# Patient Record
Sex: Female | Born: 1965 | Hispanic: No | Smoking: Current every day smoker
Health system: Southern US, Community
[De-identification: ages and names within clinical notes are randomized; demographics above are authoritative.]

## PROBLEM LIST (undated history)

## (undated) DIAGNOSIS — M797 Fibromyalgia: Secondary | ICD-10-CM

## (undated) DIAGNOSIS — J45909 Unspecified asthma, uncomplicated: Secondary | ICD-10-CM

## (undated) DIAGNOSIS — J439 Emphysema, unspecified: Secondary | ICD-10-CM

## (undated) DIAGNOSIS — J302 Other seasonal allergic rhinitis: Secondary | ICD-10-CM

## (undated) DIAGNOSIS — K2270 Barrett's esophagus without dysplasia: Secondary | ICD-10-CM

## (undated) DIAGNOSIS — M1990 Unspecified osteoarthritis, unspecified site: Secondary | ICD-10-CM

## (undated) DIAGNOSIS — J849 Interstitial pulmonary disease, unspecified: Secondary | ICD-10-CM

## (undated) DIAGNOSIS — J449 Chronic obstructive pulmonary disease, unspecified: Secondary | ICD-10-CM

## (undated) DIAGNOSIS — M810 Age-related osteoporosis without current pathological fracture: Secondary | ICD-10-CM

## (undated) HISTORY — DX: Other seasonal allergic rhinitis: J30.2

## (undated) HISTORY — DX: Chronic obstructive pulmonary disease, unspecified: J44.9

## (undated) HISTORY — DX: Emphysema, unspecified: J43.9

## (undated) HISTORY — DX: Fibromyalgia: M79.7

## (undated) HISTORY — DX: Interstitial pulmonary disease, unspecified: J84.9

## (undated) HISTORY — DX: Unspecified osteoarthritis, unspecified site: M19.90

## (undated) HISTORY — DX: Unspecified asthma, uncomplicated: J45.909

## (undated) HISTORY — DX: Barrett's esophagus without dysplasia: K22.70

## (undated) HISTORY — DX: Age-related osteoporosis without current pathological fracture: M81.0

---

## 2020-06-06 ENCOUNTER — Other Ambulatory Visit: Payer: Self-pay | Admitting: Critical Care Medicine

## 2020-06-09 ENCOUNTER — Ambulatory Visit
Admission: RE | Admit: 2020-06-09 | Discharge: 2020-06-09 | Disposition: A | Discharge status: Home / Self Care | Payer: 59 | Source: Ambulatory Visit | Attending: Critical Care Medicine | Admitting: Critical Care Medicine

## 2020-06-09 DIAGNOSIS — J45909 Unspecified asthma, uncomplicated: Secondary | ICD-10-CM | POA: Insufficient documentation

## 2020-06-09 MED ORDER — ALBUTEROL SULFATE (2.5 MG/3ML) 0.083% IN NEBU
2.5000 mg | INHALATION_SOLUTION | Freq: Once | RESPIRATORY_TRACT | Status: AC
Start: 2020-06-09 — End: 2020-06-09
  Administered 2020-06-09: 16:00:00 2.5 mg via RESPIRATORY_TRACT

## 2020-06-09 MED ORDER — ALBUTEROL SULFATE (2.5 MG/3ML) 0.083% IN NEBU
INHALATION_SOLUTION | RESPIRATORY_TRACT | Status: AC
Start: 2020-06-09 — End: ?
  Filled 2020-06-09: qty 3

## 2020-06-09 NOTE — Progress Notes (Signed)
06/09/20 1530   Spirometry Pre and/or Post   Procedure Location and Type Diagnostic - Pre and Post   Medications Albuterol   Pre FVC 3.04 L   Pre FEV1 1.28 L   Pre FEV1/FVC % (Calculated) 42   Post FVC 3.36   Post FEV1 1.47   Post FEV1/FVC % (Calculated) 43.75   Adverse Reactions None   Lung Volumes   Method Body box   Procedure status Completed   DLCO   Procedure status Completed   Primary Charges   $ Diffusing Capacity Done? Yes   $ Plethysmography Airway Resistance Performed? Yes   $ Spirometry Type Performed Pre/Post Bronchodialator - CPT [phone]

## 2020-06-13 LAB — SPIROMETRY WITH BRONCHODILATOR
DL/VA- %Pred: 53 %
DL/VA- Actual: 2.21 ml/min/mmHg/L
DL/VA- Pred: 4.17 ml/min/mmHg/L
DLCOunc (Lower Limit of Normal): 16.32 ml/min/mmHg
DLCOunc- %Pred: 53 %
DLCOunc- Actual: 11.22 ml/min/mmHg
DLCOunc- Pred: 21.12 ml/min/mmHg
ERV (Pre-Bronch) %Pred: 78 %
ERV (Pre-Bronch) Actual: 0.95 L
ERV (Pre-Bronch) Pred: 1.21 L
ERVSB (Pre-Actual): 0.95 L
FEF 25-75% (Post-Bronch) %Chng: 0 %
FEF 25-75% (Post-Bronch) %Pred: 21 %
FEF 25-75% (Post-Bronch) Actual: 0.53 L/sec
FEF 25-75% (Pre-Bronch) %Pred: 21 %
FEF 25-75% (Pre-Bronch) Actual: 0.53 L/sec
FEF 25-75% (Pre-Bronch) Pred: 2.43 L/sec
FEF Max (Post-Bronch) %Chng: 16 %
FEF Max (Post-Bronch) %Pred: 49 %
FEF Max (Post-Bronch) Actual: 3.34 L/sec
FEF Max (Pre-Bronch) %Pred: 42 %
FEF Max (Pre-Bronch) Actual: 2.87 L/sec
FEF Max (Pre-Bronch) Pred: 6.81 L/sec
FEF2575 (Lower Limit of Normal): 1.29 L/sec
FEF2575 (Standard Deviation): 0.8 L/sec
FEFMax (Lower Limit of Normal): 4.98 L/sec
FEFMax (Standard Deviation): 1.11 L/sec
FEV1 (Lower Limit of Normal): 1.98 L
FEV1 (Post-Bronch) %Chng: 14 %
FEV1 (Post-Bronch) %Pred: 56 %
FEV1 (Post-Bronch) Post: 1.47 L
FEV1 (Pre-Bronch) %Pred: 49 %
FEV1 (Pre-Bronch) Actual: 1.28 L
FEV1 (Pre-Bronch) Pred: 2.59 L
FEV1 (Standard Deviation): 0.37 L
FEV1/FVC (Pre-Bronch) %Pred: 52 %
FEV1/FVC (Pre-Bronch) Actual: 42 %
FEV1/FVC (Pre-Bronch) Pred: 80 %
FVC (Lower Limit of Normal): 2.53 L
FVC (Post-Bronch) %Chng: 10 %
FVC (Post-Bronch) %Pred: 103 %
FVC (Post-Bronch) Actual: 3.36 L
FVC (Pre-Bronch) %Pred: 93 %
FVC (Pre-Bronch) Actual: 3.04 L
FVC (Pre-Bronch) Pred: 3.24 L
FVC (Standard Deviation): 0.44 L
Gaw (Pre-Bronch) %Pred: 38 %
Gaw (Pre-Bronch) Actual: 0.39 L/s/cmH2O
Gaw (Pre-Bronch) Pred: 1.03 L/s/cmH2O
IC (Pre-Bronch) %Pred: 87 %
IC (Pre-Bronch) Actual: 2.12 L
IC (Pre-Bronch) Pred: 2.41 L
ICTLCPleth (Pre-Actual): 33 %
ICTLCSB (Pre-Actual): 40 %
PEF (% Change-Post): 16 %
PEF (Post): 200.5 L/min
PEF (Pre-Actual): 172.3 L/min
RV (Pleth) (Pre-Bronch) %Pred: 162 %
RV (Pleth) (Pre-Bronch) Actual: 3.27 L
RV (Pleth) (Pre-Bronch) Pred: 2.01 L
RV/TLC (Pleth) (Pre-Bronch) %Pred: 137 %
RV/TLC (Pleth) (Pre-Bronch) Actual: 52 %
RV/TLC (Pleth) (Pre-Bronch) Pred: 38 %
RVPleth (Lower Limit of Normal): 1.25 L
RVPleth (Standard Deviation): 0.38 L
RVSB (Pre-Actual): 2.2 L
RVTLCPl (Lower Limit of Normal): 27 %
RVTLCPl (Standard Deviation): 5 %
RVTLCSB (Pre-Actual): 42 %
Raw (Lower Limit of Normal): 1.15 cmH2O/L/s
Raw (Pre-Bronch) %Pred: 141 %
Raw (Pre-Bronch) Actual: 2.62 cmH2O/L/s
Raw (Pre-Bronch) Pred: 1.86 cmH2O/L/s
Raw (Standard Deviation): 0.43 cmH2O/L/s
SVCSB (Pre-Actual): 3.05 L
TGV (Pre-Bronch) %Pred: 139 %
TGV (Pre-Bronch) Actual: 4.22 L
TGV (Pre-Bronch) Pred: 3.03 L
TGVPleth (Lower Limit of Normal): 1.98 L
TGVPleth (Standard Deviation): 0.52 L
TLC (Pleth) (Pre-Bronch) %Pred: 118 %
TLC (Pleth) (Pre-Bronch) Actual: 6.34 L
TLC (Pleth) (Pre-Bronch) Pred: 5.35 L
TLCPleth (Lower Limit of Normal): 4.28 L
TLCPleth (Standard Deviation): 0.54 L
VA (% Predicted-Pre): 100 %
VA (Lower Limit of Normal): 4.12 L
VA (Pre-Actual): 5.08 L
VA- Pred: 5.07 L
sGaw (Lower Limit of Normal): 0.14 1/cmH2O*s
sGaw (Pre-Bronch) %Pred: 37 %
sGaw (Pre-Bronch) Actual: 0.08 1/cmH2O*s
sGaw (Pre-Bronch) Pred: 0.2 1/cmH2O*s
sGaw (Standard Deviation): 0.04 1/cmH2O*s
sRaw (Pre-Bronch) %Pred: 280 %
sRaw (Pre-Bronch) Actual: 13.33 cmH2O*s
sRaw (Pre-Bronch) Pred: 4.76 cmH2O*s

## 2020-06-13 LAB — LUNG VOLUMES
DL/VA- %Pred: 53 %
DL/VA- Actual: 2.21 ml/min/mmHg/L
DL/VA- Pred: 4.17 ml/min/mmHg/L
DLCOunc (Lower Limit of Normal): 16.32 ml/min/mmHg
DLCOunc- %Pred: 53 %
DLCOunc- Actual: 11.22 ml/min/mmHg
DLCOunc- Pred: 21.12 ml/min/mmHg
ERV (Pre-Bronch) %Pred: 78 %
ERV (Pre-Bronch) Actual: 0.95 L
ERV (Pre-Bronch) Pred: 1.21 L
ERVSB (Pre-Actual): 0.95 L
FEF 25-75% (Post-Bronch) %Chng: 0 %
FEF 25-75% (Post-Bronch) %Pred: 21 %
FEF 25-75% (Post-Bronch) Actual: 0.53 L/sec
FEF 25-75% (Pre-Bronch) %Pred: 21 %
FEF 25-75% (Pre-Bronch) Actual: 0.53 L/sec
FEF 25-75% (Pre-Bronch) Pred: 2.43 L/sec
FEF Max (Post-Bronch) %Chng: 16 %
FEF Max (Post-Bronch) %Pred: 49 %
FEF Max (Post-Bronch) Actual: 3.34 L/sec
FEF Max (Pre-Bronch) %Pred: 42 %
FEF Max (Pre-Bronch) Actual: 2.87 L/sec
FEF Max (Pre-Bronch) Pred: 6.81 L/sec
FEF2575 (Lower Limit of Normal): 1.29 L/sec
FEF2575 (Standard Deviation): 0.8 L/sec
FEFMax (Lower Limit of Normal): 4.98 L/sec
FEFMax (Standard Deviation): 1.11 L/sec
FEV1 (Lower Limit of Normal): 1.98 L
FEV1 (Post-Bronch) %Chng: 14 %
FEV1 (Post-Bronch) %Pred: 56 %
FEV1 (Post-Bronch) Post: 1.47 L
FEV1 (Pre-Bronch) %Pred: 49 %
FEV1 (Pre-Bronch) Actual: 1.28 L
FEV1 (Pre-Bronch) Pred: 2.59 L
FEV1 (Standard Deviation): 0.37 L
FEV1/FVC (Pre-Bronch) %Pred: 52 %
FEV1/FVC (Pre-Bronch) Actual: 42 %
FEV1/FVC (Pre-Bronch) Pred: 80 %
FVC (Lower Limit of Normal): 2.53 L
FVC (Post-Bronch) %Chng: 10 %
FVC (Post-Bronch) %Pred: 103 %
FVC (Post-Bronch) Actual: 3.36 L
FVC (Pre-Bronch) %Pred: 93 %
FVC (Pre-Bronch) Actual: 3.04 L
FVC (Pre-Bronch) Pred: 3.24 L
FVC (Standard Deviation): 0.44 L
Gaw (Pre-Bronch) %Pred: 38 %
Gaw (Pre-Bronch) Actual: 0.39 L/s/cmH2O
Gaw (Pre-Bronch) Pred: 1.03 L/s/cmH2O
IC (Pre-Bronch) %Pred: 87 %
IC (Pre-Bronch) Actual: 2.12 L
IC (Pre-Bronch) Pred: 2.41 L
ICTLCPleth (Pre-Actual): 33 %
ICTLCSB (Pre-Actual): 40 %
PEF (% Change-Post): 16 %
PEF (Post): 200.5 L/min
PEF (Pre-Actual): 172.3 L/min
RV (Pleth) (Pre-Bronch) %Pred: 162 %
RV (Pleth) (Pre-Bronch) Actual: 3.27 L
RV (Pleth) (Pre-Bronch) Pred: 2.01 L
RV/TLC (Pleth) (Pre-Bronch) %Pred: 137 %
RV/TLC (Pleth) (Pre-Bronch) Actual: 52 %
RV/TLC (Pleth) (Pre-Bronch) Pred: 38 %
RVPleth (Lower Limit of Normal): 1.25 L
RVPleth (Standard Deviation): 0.38 L
RVSB (Pre-Actual): 2.2 L
RVTLCPl (Lower Limit of Normal): 27 %
RVTLCPl (Standard Deviation): 5 %
RVTLCSB (Pre-Actual): 42 %
Raw (Lower Limit of Normal): 1.15 cmH2O/L/s
Raw (Pre-Bronch) %Pred: 141 %
Raw (Pre-Bronch) Actual: 2.62 cmH2O/L/s
Raw (Pre-Bronch) Pred: 1.86 cmH2O/L/s
Raw (Standard Deviation): 0.43 cmH2O/L/s
SVCSB (Pre-Actual): 3.05 L
TGV (Pre-Bronch) %Pred: 139 %
TGV (Pre-Bronch) Actual: 4.22 L
TGV (Pre-Bronch) Pred: 3.03 L
TGVPleth (Lower Limit of Normal): 1.98 L
TGVPleth (Standard Deviation): 0.52 L
TLC (Pleth) (Pre-Bronch) %Pred: 118 %
TLC (Pleth) (Pre-Bronch) Actual: 6.34 L
TLC (Pleth) (Pre-Bronch) Pred: 5.35 L
TLCPleth (Lower Limit of Normal): 4.28 L
TLCPleth (Standard Deviation): 0.54 L
VA (% Predicted-Pre): 100 %
VA (Lower Limit of Normal): 4.12 L
VA (Pre-Actual): 5.08 L
VA- Pred: 5.07 L
sGaw (Lower Limit of Normal): 0.14 1/cmH2O*s
sGaw (Pre-Bronch) %Pred: 37 %
sGaw (Pre-Bronch) Actual: 0.08 1/cmH2O*s
sGaw (Pre-Bronch) Pred: 0.2 1/cmH2O*s
sGaw (Standard Deviation): 0.04 1/cmH2O*s
sRaw (Pre-Bronch) %Pred: 280 %
sRaw (Pre-Bronch) Actual: 13.33 cmH2O*s
sRaw (Pre-Bronch) Pred: 4.76 cmH2O*s

## 2020-06-13 LAB — CARBON MONOXIDE DIFFUSING CAPACITY
DL/VA- %Pred: 53 %
DL/VA- Actual: 2.21 ml/min/mmHg/L
DL/VA- Pred: 4.17 ml/min/mmHg/L
DLCOunc (Lower Limit of Normal): 16.32 ml/min/mmHg
DLCOunc- %Pred: 53 %
DLCOunc- Actual: 11.22 ml/min/mmHg
DLCOunc- Pred: 21.12 ml/min/mmHg
ERV (Pre-Bronch) %Pred: 78 %
ERV (Pre-Bronch) Actual: 0.95 L
ERV (Pre-Bronch) Pred: 1.21 L
ERVSB (Pre-Actual): 0.95 L
FEF 25-75% (Post-Bronch) %Chng: 0 %
FEF 25-75% (Post-Bronch) %Pred: 21 %
FEF 25-75% (Post-Bronch) Actual: 0.53 L/sec
FEF 25-75% (Pre-Bronch) %Pred: 21 %
FEF 25-75% (Pre-Bronch) Actual: 0.53 L/sec
FEF 25-75% (Pre-Bronch) Pred: 2.43 L/sec
FEF Max (Post-Bronch) %Chng: 16 %
FEF Max (Post-Bronch) %Pred: 49 %
FEF Max (Post-Bronch) Actual: 3.34 L/sec
FEF Max (Pre-Bronch) %Pred: 42 %
FEF Max (Pre-Bronch) Actual: 2.87 L/sec
FEF Max (Pre-Bronch) Pred: 6.81 L/sec
FEF2575 (Lower Limit of Normal): 1.29 L/sec
FEF2575 (Standard Deviation): 0.8 L/sec
FEFMax (Lower Limit of Normal): 4.98 L/sec
FEFMax (Standard Deviation): 1.11 L/sec
FEV1 (Lower Limit of Normal): 1.98 L
FEV1 (Post-Bronch) %Chng: 14 %
FEV1 (Post-Bronch) %Pred: 56 %
FEV1 (Post-Bronch) Post: 1.47 L
FEV1 (Pre-Bronch) %Pred: 49 %
FEV1 (Pre-Bronch) Actual: 1.28 L
FEV1 (Pre-Bronch) Pred: 2.59 L
FEV1 (Standard Deviation): 0.37 L
FEV1/FVC (Pre-Bronch) %Pred: 52 %
FEV1/FVC (Pre-Bronch) Actual: 42 %
FEV1/FVC (Pre-Bronch) Pred: 80 %
FVC (Lower Limit of Normal): 2.53 L
FVC (Post-Bronch) %Chng: 10 %
FVC (Post-Bronch) %Pred: 103 %
FVC (Post-Bronch) Actual: 3.36 L
FVC (Pre-Bronch) %Pred: 93 %
FVC (Pre-Bronch) Actual: 3.04 L
FVC (Pre-Bronch) Pred: 3.24 L
FVC (Standard Deviation): 0.44 L
Gaw (Pre-Bronch) %Pred: 38 %
Gaw (Pre-Bronch) Actual: 0.39 L/s/cmH2O
Gaw (Pre-Bronch) Pred: 1.03 L/s/cmH2O
IC (Pre-Bronch) %Pred: 87 %
IC (Pre-Bronch) Actual: 2.12 L
IC (Pre-Bronch) Pred: 2.41 L
ICTLCPleth (Pre-Actual): 33 %
ICTLCSB (Pre-Actual): 40 %
PEF (% Change-Post): 16 %
PEF (Post): 200.5 L/min
PEF (Pre-Actual): 172.3 L/min
RV (Pleth) (Pre-Bronch) %Pred: 162 %
RV (Pleth) (Pre-Bronch) Actual: 3.27 L
RV (Pleth) (Pre-Bronch) Pred: 2.01 L
RV/TLC (Pleth) (Pre-Bronch) %Pred: 137 %
RV/TLC (Pleth) (Pre-Bronch) Actual: 52 %
RV/TLC (Pleth) (Pre-Bronch) Pred: 38 %
RVPleth (Lower Limit of Normal): 1.25 L
RVPleth (Standard Deviation): 0.38 L
RVSB (Pre-Actual): 2.2 L
RVTLCPl (Lower Limit of Normal): 27 %
RVTLCPl (Standard Deviation): 5 %
RVTLCSB (Pre-Actual): 42 %
Raw (Lower Limit of Normal): 1.15 cmH2O/L/s
Raw (Pre-Bronch) %Pred: 141 %
Raw (Pre-Bronch) Actual: 2.62 cmH2O/L/s
Raw (Pre-Bronch) Pred: 1.86 cmH2O/L/s
Raw (Standard Deviation): 0.43 cmH2O/L/s
SVCSB (Pre-Actual): 3.05 L
TGV (Pre-Bronch) %Pred: 139 %
TGV (Pre-Bronch) Actual: 4.22 L
TGV (Pre-Bronch) Pred: 3.03 L
TGVPleth (Lower Limit of Normal): 1.98 L
TGVPleth (Standard Deviation): 0.52 L
TLC (Pleth) (Pre-Bronch) %Pred: 118 %
TLC (Pleth) (Pre-Bronch) Actual: 6.34 L
TLC (Pleth) (Pre-Bronch) Pred: 5.35 L
TLCPleth (Lower Limit of Normal): 4.28 L
TLCPleth (Standard Deviation): 0.54 L
VA (% Predicted-Pre): 100 %
VA (Lower Limit of Normal): 4.12 L
VA (Pre-Actual): 5.08 L
VA- Pred: 5.07 L
sGaw (Lower Limit of Normal): 0.14 1/cmH2O*s
sGaw (Pre-Bronch) %Pred: 37 %
sGaw (Pre-Bronch) Actual: 0.08 1/cmH2O*s
sGaw (Pre-Bronch) Pred: 0.2 1/cmH2O*s
sGaw (Standard Deviation): 0.04 1/cmH2O*s
sRaw (Pre-Bronch) %Pred: 280 %
sRaw (Pre-Bronch) Actual: 13.33 cmH2O*s
sRaw (Pre-Bronch) Pred: 4.76 cmH2O*s

## 2020-06-19 ENCOUNTER — Other Ambulatory Visit: Payer: Self-pay | Admitting: Orthopaedic Surgery

## 2020-06-19 DIAGNOSIS — M25552 Pain in left hip: Secondary | ICD-10-CM

## 2020-06-19 DIAGNOSIS — M25551 Pain in right hip: Secondary | ICD-10-CM

## 2020-06-20 ENCOUNTER — Ambulatory Visit: Payer: 59

## 2020-06-22 ENCOUNTER — Other Ambulatory Visit: Payer: Self-pay | Admitting: Orthopaedic Surgery

## 2020-06-22 ENCOUNTER — Ambulatory Visit
Admission: RE | Admit: 2020-06-22 | Discharge: 2020-06-22 | Disposition: A | Discharge status: Home / Self Care | Payer: 59 | Source: Ambulatory Visit | Attending: Orthopaedic Surgery | Admitting: Orthopaedic Surgery

## 2020-06-22 DIAGNOSIS — M25551 Pain in right hip: Secondary | ICD-10-CM | POA: Insufficient documentation

## 2020-06-22 DIAGNOSIS — M25552 Pain in left hip: Secondary | ICD-10-CM

## 2020-06-22 DIAGNOSIS — S73192A Other sprain of left hip, initial encounter: Secondary | ICD-10-CM | POA: Insufficient documentation

## 2020-06-22 DIAGNOSIS — X58XXXA Exposure to other specified factors, initial encounter: Secondary | ICD-10-CM | POA: Insufficient documentation

## 2020-06-22 DIAGNOSIS — M7062 Trochanteric bursitis, left hip: Secondary | ICD-10-CM | POA: Insufficient documentation

## 2020-07-06 ENCOUNTER — Other Ambulatory Visit (INDEPENDENT_AMBULATORY_CARE_PROVIDER_SITE_OTHER): Payer: Self-pay | Admitting: Internal Medicine

## 2020-07-06 ENCOUNTER — Other Ambulatory Visit: Payer: 59

## 2020-07-06 ENCOUNTER — Encounter (INDEPENDENT_AMBULATORY_CARE_PROVIDER_SITE_OTHER): Payer: Self-pay

## 2020-07-06 ENCOUNTER — Ambulatory Visit (INDEPENDENT_AMBULATORY_CARE_PROVIDER_SITE_OTHER): Payer: 59 | Admitting: Internal Medicine

## 2020-07-06 ENCOUNTER — Encounter (INDEPENDENT_AMBULATORY_CARE_PROVIDER_SITE_OTHER): Payer: Self-pay | Admitting: Internal Medicine

## 2020-07-06 VITALS — BP 114/79 | HR 73 | Temp 98.1°F | Resp 16 | Ht 66.5 in | Wt 123.5 lb

## 2020-07-06 DIAGNOSIS — M797 Fibromyalgia: Secondary | ICD-10-CM | POA: Insufficient documentation

## 2020-07-06 DIAGNOSIS — M5440 Lumbago with sciatica, unspecified side: Secondary | ICD-10-CM

## 2020-07-06 DIAGNOSIS — M79601 Pain in right arm: Secondary | ICD-10-CM

## 2020-07-06 DIAGNOSIS — M810 Age-related osteoporosis without current pathological fracture: Secondary | ICD-10-CM

## 2020-07-06 DIAGNOSIS — M79602 Pain in left arm: Secondary | ICD-10-CM

## 2020-07-06 MED ORDER — VITAMIN D (ERGOCALCIFEROL) 1.25 MG (50000 UT) PO CAPS
50000.0000 [IU] | ORAL_CAPSULE | ORAL | 3 refills | Status: AC
Start: 2020-07-06 — End: ?

## 2020-07-06 MED ORDER — GABAPENTIN 300 MG PO CAPS
300.0000 mg | ORAL_CAPSULE | Freq: Three times a day (TID) | ORAL | 2 refills | Status: AC
Start: 2020-07-06 — End: 2020-10-04

## 2020-07-06 NOTE — Patient Instructions (Signed)
1. Check labs   2. Start neurontin 300 mg tid  3. Suggest cymbalta 60 mg daily  4. Suggest fosamax 70 mg weekly,   5. Suggest calcium 500 mg bid and Vit D 50,000 iu weekly   6. Stop smoking   7. Suggest exercise and PT   8. Follow 6-8  weeks

## 2020-07-06 NOTE — Progress Notes (Signed)
Initial Rheumatology Consultation    Chief Complaint:     Chief Complaint   Patient presents with    Lupus     Pulm Dr Nance Pew     Fibromyalgia     x15 yrs    Arthritis     all extremities     ILD     See pulm          HPI:   This patient is a 55 y.o. 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 Delsa Grana to or evaluation of joint pain.     She moved to [place] several months ago.     She had rash of left arm, left chest wall and abdomen. She saw dermatology in [facility] in 2017 and had skin biopsy. She was diagnosed with  Tumid lupus erythematosus. She used steroid cream. Her rash resolved. She has mild itches of back of her scalp.     She feels chronic fatigue and has pain of hands, shoulders, hips, knees, ankles and feet. She has pain of neck and lower back with numbness of arms and legs. She has fibromyalgia and tried NSAIDs.   She had congenital Clubfoot and had several surgeries of both feet.   She has pain of both hips. She saw ortho and is going to do PT. She takes etodolac 600 mg/d.     She has depression and is on cymbalta 60 mg.      PMSH:     Past Medical History:   Diagnosis Date    Arthritis     Asthma     Barrett's esophagus     COPD (chronic obstructive pulmonary disease)     Emphysema lung     Fibromyalgia     Interstitial lung disease     Osteoporosis     Seasonal allergic rhinitis        Past Surgical History:   Procedure Laterality Date    ELBOW SURGERY      (L) elbow    HYSTERECTOMY      torn labrium         Allergies:   No Known Allergies    Meds:     Current Outpatient Medications:     albuterol (PROVENTIL) (5 MG/ML) 0.5% nebulizer solution, , Disp: , Rfl:     alendronate (FOSAMAX) 70 MG tablet,  70 mg, 1 tab(s), Oral, q7day, # 4 tab(s), 0 Refill(s), Disp: , Rfl:     Breo Ellipta 100-25 MCG/INH Aerosol Pwdr,  Breath Activated, TAKE 1 PUFF BY MOUTH EVERY DAY, Disp: , Rfl:     doxycycline (VIBRA-TABS) 100 MG tablet, Take 100 mg by mouth every 12 (twelve) hours, Disp: , Rfl:     DULoxetine (CYMBALTA) 60 MG capsule, 1 capsule every 24 hours, Disp: , Rfl:     etodolac (LODINE XL) 600 MG 24 hr tablet, Take 600 mg by mouth daily as needed, Disp: , Rfl:     Multiple Vitamins-Minerals (ONE-A-DAY 50 PLUS PO), Take by mouth, Disp: , Rfl:     ondansetron (ZOFRAN-ODT) 4 MG disintegrating tablet, Take 4 mg by mouth, Disp: , Rfl:     tiotropium (Spiriva HandiHaler) 18 MCG inhalation capsule, , Disp: , Rfl:     triamcinolone (KENALOG) 0.1 % cream, APPLY UP TO TWICE A DAY FOR 2 WEEKS/MONTH AS NEEDED. AVOID PROLONGED USE, Disp: ,  Rfl:     gabapentin (NEURONTIN) 300 MG capsule, Take 1 capsule (300 mg total) by mouth 3 (three) times daily, Disp: 90 capsule, Rfl: 2    vitamin D, ergocalciferol, (DRISDOL) 50000 UNIT Cap, Take 1 capsule (50,000 Units total) by mouth once a week, Disp: 12 capsule, Rfl: 3    FH:     Family History   Problem Relation Age of Onset    Heart disease Mother     Emphysema Father        SH:     Social History     Socioeconomic History    Marital status: Widowed   Tobacco Use    Smoking status: Current Every Day Smoker     Packs/day: 0.50     Types: Cigarettes    Smokeless tobacco: Never Used   Vaping Use    Vaping Use: Never used   Substance and Sexual Activity    Alcohol use: Not Currently    Drug use: Yes     Types: Marijuana     Comment: edibles       ROS:   All other systems reviewed and negative except as described above.        PHYSICAL EXAM:     Vitals:    07/06/20 0815   BP: 114/79   Pulse: 73   Resp: 16   Temp: 98.1 F (36.7 C)   SpO2: 98%         General appearance - alert, well appearing, and in no distress  Mental status - alert, oriented to person, place, and time  Eyes - extraocular eye movements intact  Back exam - ++ tenderness of para L-spine   Neurological - alert, oriented, normal  speech, no focal findings or movement disorder noted  Extremities - peripheral pulses normal, no pedal edema, no clubbing or cyanosis  Skin - normal coloration and turgor, no rashes.   Musculoskeletal - No tenderness of MCPs and PIPs. Good range of motion of shoulders, elbows, wrists and small joints of hands. Decrease ROM of hips. Good range of motion of knees and ankles.  Knees crepitus with range of motion of knees without effusions. Ankles without effusions.  No tenderness of MTPs or effusions.  Diffuse tenderness in fibromyalgia distribution.         Labs:         Radiology:     MRI of hips on 06/22/2020:   IMPRESSION:    Right hip   Left hip  1.  Mild left hip degenerative changes with low-grade cartilage thinning  and anterosuperior labral tear.  2.  Moderate left proximal hamstring tendinosis and mild ischial  bursitis.  3.  Mild to moderate distal gluteus minimus tendinosis and mild greater  trochanteric bursitis.     ASSESSMENT:   Ruth Mclean is a 55 y.o. 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           PLAN:     1. Check labs   2. Start neurontin 300 mg tid  3. Suggest cymbalta 60 mg daily  4. Suggest fosamax 70 mg weekly,   5. Suggest calcium 500 mg bid and Vit D 50,000 iu weekly   6. Stop smoking   7. Suggest exercise and PT   8. Follow 6-8  weeks

## 2020-07-07 LAB — COMPREHENSIVE METABOLIC PANEL
ALT: 15 IU/L (ref 0–32)
AST (SGOT): 19 IU/L (ref 0–40)
Albumin/Globulin Ratio: 2 (ref 1.2–2.2)
Albumin: 4.3 g/dL (ref 3.8–4.9)
Alkaline Phosphatase: 97 IU/L (ref 44–121)
BUN / Creatinine Ratio: 15 (ref 9–23)
BUN: 8 mg/dL (ref 6–24)
Bilirubin, Total: 0.3 mg/dL (ref 0.0–1.2)
CO2: 22 mmol/L (ref 20–29)
Calcium: 9.6 mg/dL (ref 8.7–10.2)
Chloride: 101 mmol/L (ref 96–106)
Creatinine: 0.55 mg/dL — ABNORMAL LOW (ref 0.57–1.00)
Globulin, Total: 2.2 g/dL (ref 1.5–4.5)
Glucose: 101 mg/dL — ABNORMAL HIGH (ref 65–99)
Potassium: 4.9 mmol/L (ref 3.5–5.2)
Protein, Total: 6.5 g/dL (ref 6.0–8.5)
Sodium: 139 mmol/L (ref 134–144)
eGFR: 108 mL/min/{1.73_m2} (ref >59)

## 2020-07-07 LAB — CBC AND DIFFERENTIAL
Baso(Absolute): 0 10*3/uL (ref 0.0–0.2)
Basos: 1 %
Eos: 3 %
Eosinophils Absolute: 0.2 10*3/uL (ref 0.0–0.4)
Hematocrit: 40.9 % (ref 34.0–46.6)
Hemoglobin: 14 g/dL (ref 11.1–15.9)
Immature Granulocytes Absolute: 0 10*3/uL (ref 0.0–0.1)
Immature Granulocytes: 0 %
Lymphocytes Absolute: 1.4 10*3/uL (ref 0.7–3.1)
Lymphocytes: 27 %
MCH: 30.8 pg (ref 26.6–33.0)
MCHC: 34.2 g/dL (ref 31.5–35.7)
MCV: 90 fL (ref 79–97)
Monocytes Absolute: 0.4 10*3/uL (ref 0.1–0.9)
Monocytes: 7 %
Neutrophils Absolute: 3.3 10*3/uL (ref 1.4–7.0)
Neutrophils: 62 %
Platelets: 393 10*3/uL (ref 150–450)
RBC: 4.55 x10E6/uL (ref 3.77–5.28)
RDW: 13.2 % (ref 11.7–15.4)
WBC: 5.3 10*3/uL (ref 3.4–10.8)

## 2020-07-07 LAB — URINALYSIS
Bilirubin, UA: NEGATIVE
Blood, UA: NEGATIVE
Glucose, UA: NEGATIVE
Ketones UA: NEGATIVE
Leukocyte Esterase, UA: NEGATIVE
Nitrite, UA: NEGATIVE
Protein, UA: NEGATIVE
Urine Specific Gravity: 1.012 (ref 1.005–1.030)
Urobilinogen, Ur: 0.2 mg/dL (ref 0.2–1.0)
pH, UA: 8.5 — ABNORMAL HIGH (ref 5.0–7.5)

## 2020-07-07 LAB — VITAMIN D,25 OH,TOTAL: Vitamin D 25-Hydroxy: 30.6 ng/mL (ref 30.0–100.0)

## 2020-07-07 LAB — FOLATE: Folate: 9.5 ng/mL (ref >3.0)

## 2020-07-07 LAB — SEDIMENTATION RATE: Sed Rate: 2 mm/hr (ref 0–40)

## 2020-07-07 LAB — TSH: TSH: 1.25 u[IU]/mL (ref 0.450–4.500)

## 2020-07-07 LAB — C-REACTIVE PROTEIN: C-Reactive Protein: 3 mg/L (ref 0–10)

## 2020-07-07 LAB — VITAMIN B12: Vitamin B-12: 626 pg/mL (ref 232–1245)

## 2020-07-07 LAB — RHEUMATOID FACTOR: RA Latex Turbid.: <10 IU/mL (ref <14.0)

## 2020-07-08 LAB — CCP ANTIBODIES IGG/IGA: CCP Antibodies IgG/IgA: 7 units (ref 0–19)

## 2020-07-11 LAB — ANA IFA W/REFLEX TO TITER/PATTERN/AB: Antinuclear Antibodies (ANA): NEGATIVE

## 2020-09-17 ENCOUNTER — Emergency Department: Payer: 59

## 2020-09-17 ENCOUNTER — Emergency Department
Admission: EM | Admit: 2020-09-17 | Discharge: 2020-09-17 | Disposition: A | Discharge status: Home / Self Care | Payer: 59 | Attending: Emergency Medicine | Admitting: Emergency Medicine

## 2020-09-17 DIAGNOSIS — F17210 Nicotine dependence, cigarettes, uncomplicated: Secondary | ICD-10-CM | POA: Insufficient documentation

## 2020-09-17 DIAGNOSIS — J441 Chronic obstructive pulmonary disease with (acute) exacerbation: Secondary | ICD-10-CM | POA: Insufficient documentation

## 2020-09-17 DIAGNOSIS — Z20822 Contact with and (suspected) exposure to covid-19: Secondary | ICD-10-CM | POA: Insufficient documentation

## 2020-09-17 LAB — CBC AND DIFFERENTIAL
Absolute NRBC: 0 10*3/uL (ref 0.00–0.00)
Basophils Absolute Automated: 0.05 10*3/uL (ref 0.00–0.08)
Basophils Automated: 0.5 %
Eosinophils Absolute Automated: 0.22 10*3/uL (ref 0.00–0.44)
Eosinophils Automated: 2.2 %
Hematocrit: 39.1 % (ref 34.7–43.7)
Hgb: 13.3 g/dL (ref 11.4–14.8)
Immature Granulocytes Absolute: 0.02 10*3/uL (ref 0.00–0.07)
Immature Granulocytes: 0.2 %
Lymphocytes Absolute Automated: 2.07 10*3/uL (ref 0.42–3.22)
Lymphocytes Automated: 20.4 %
MCH: 30.4 pg (ref 25.1–33.5)
MCHC: 34 g/dL (ref 31.5–35.8)
MCV: 89.3 fL (ref 78.0–96.0)
MPV: 9.5 fL (ref 8.9–12.5)
Monocytes Absolute Automated: 0.7 10*3/uL (ref 0.21–0.85)
Monocytes: 6.9 %
Neutrophils Absolute: 7.07 10*3/uL — ABNORMAL HIGH (ref 1.10–6.33)
Neutrophils: 69.8 %
Nucleated RBC: 0 /100 WBC (ref 0.0–0.0)
Platelets: 371 10*3/uL — ABNORMAL HIGH (ref 142–346)
RBC: 4.38 10*6/uL (ref 3.90–5.10)
RDW: 14 % (ref 11–15)
WBC: 10.13 10*3/uL — ABNORMAL HIGH (ref 3.10–9.50)

## 2020-09-17 LAB — COMPREHENSIVE METABOLIC PANEL
ALT: 23 U/L (ref 0–55)
AST (SGOT): 17 U/L (ref 5–34)
Albumin/Globulin Ratio: 1.5 (ref 0.9–2.2)
Albumin: 3.8 g/dL (ref 3.5–5.0)
Alkaline Phosphatase: 103 U/L (ref 37–117)
Anion Gap: 12 (ref 5.0–15.0)
BUN: 9 mg/dL (ref 7.0–19.0)
Bilirubin, Total: 0.6 mg/dL (ref 0.2–1.2)
CO2: 25 mEq/L (ref 22–29)
Calcium: 9.5 mg/dL (ref 8.5–10.5)
Chloride: 96 mEq/L — ABNORMAL LOW (ref 100–111)
Creatinine: 0.6 mg/dL (ref 0.6–1.0)
Globulin: 2.6 g/dL (ref 2.0–3.6)
Glucose: 84 mg/dL (ref 70–100)
Potassium: 4.4 mEq/L (ref 3.5–5.1)
Protein, Total: 6.4 g/dL (ref 6.0–8.3)
Sodium: 133 mEq/L — ABNORMAL LOW (ref 136–145)

## 2020-09-17 LAB — COVID-19 (SARS-COV-2): SARS CoV-2: NEGATIVE

## 2020-09-17 LAB — GFR: EGFR: >60

## 2020-09-17 MED ORDER — CEFTRIAXONE SODIUM 1 G IJ SOLR
1.0000 g | Freq: Once | INTRAMUSCULAR | Status: AC
Start: 2020-09-17 — End: 2020-09-17
  Administered 2020-09-17: 1 g via INTRAVENOUS
  Filled 2020-09-17: qty 1000

## 2020-09-17 MED ORDER — SODIUM CHLORIDE 0.9 % IV BOLUS
1000.0000 mL | Freq: Once | INTRAVENOUS | Status: AC
Start: 2020-09-17 — End: 2020-09-17
  Administered 2020-09-17: 1000 mL via INTRAVENOUS

## 2020-09-17 MED ORDER — PREDNISONE 20 MG PO TABS
40.0000 mg | ORAL_TABLET | Freq: Every day | ORAL | 0 refills | Status: AC
Start: 2020-09-17 — End: 2020-09-22

## 2020-09-17 MED ORDER — IPRATROPIUM BROMIDE 0.02 % IN SOLN
0.5000 mg | Freq: Once | RESPIRATORY_TRACT | Status: AC
Start: 2020-09-17 — End: 2020-09-17
  Administered 2020-09-17: 0.5 mg via RESPIRATORY_TRACT
  Filled 2020-09-17: qty 2.5

## 2020-09-17 MED ORDER — METHYLPREDNISOLONE SODIUM SUCC 125 MG IJ SOLR
125.0000 mg | Freq: Once | INTRAMUSCULAR | Status: AC
Start: 2020-09-17 — End: 2020-09-17
  Administered 2020-09-17: 125 mg via INTRAVENOUS
  Filled 2020-09-17: qty 2

## 2020-09-17 MED ORDER — LEVOFLOXACIN 500 MG PO TABS
500.0000 mg | ORAL_TABLET | Freq: Every day | ORAL | 0 refills | Status: AC
Start: 2020-09-17 — End: 2020-09-24

## 2020-09-17 MED ORDER — ONDANSETRON HCL 4 MG/2ML IJ SOLN
4.0000 mg | Freq: Once | INTRAMUSCULAR | Status: AC
Start: 2020-09-17 — End: 2020-09-17
  Administered 2020-09-17: 4 mg via INTRAVENOUS
  Filled 2020-09-17: qty 2

## 2020-09-17 MED ORDER — ONDANSETRON 4 MG PO TBDP
4.0000 mg | ORAL_TABLET | Freq: Four times a day (QID) | ORAL | 0 refills | Status: AC | PRN
Start: 2020-09-17 — End: ?

## 2020-09-17 MED ORDER — ALBUTEROL SULFATE (2.5 MG/3ML) 0.083% IN NEBU
2.5000 mg | INHALATION_SOLUTION | Freq: Once | RESPIRATORY_TRACT | Status: AC
Start: 2020-09-17 — End: 2020-09-17
  Administered 2020-09-17: 2.5 mg via RESPIRATORY_TRACT
  Filled 2020-09-17: qty 3

## 2020-09-17 MED ORDER — AZITHROMYCIN 250 MG PO TABS
500.0000 mg | ORAL_TABLET | Freq: Once | ORAL | Status: AC
Start: 2020-09-17 — End: 2020-09-17
  Administered 2020-09-17: 500 mg via ORAL
  Filled 2020-09-17: qty 2

## 2020-09-17 NOTE — ED Triage Notes (Signed)
Pt states " i think I might have pna",  Reports sob started a couple of weeks ago, this past week became more sob, productive cough w/green/yellow mucous, generalized weakness and nausea.

## 2020-09-17 NOTE — ED Provider Notes (Signed)
History     Chief Complaint   Patient presents with   . Shortness of Breath   . Cough     HPI   55 year old female with a past medical history of COPD, fibromyalgia, interstitial lung disease, asthma, pneumonia is presenting to the emergency department with 1 week of cough congestion and chills.  Patient states she feels similar to when she had pneumonia in the past.  Patient's been having a yellow-green productive cough increased work of breathing and wheezing.  Patient is been trying her inhalers at home with minimal improvement.  Patient is vaccine against COVID.  No known sick contacts.  No nausea or vomiting.  No neck stiffness or tenderness.  No history of DVT or PE.  Patient was concern for increased work of breathing and productive cough that she might have pneumonia and came in for further evaluation.  Past Medical History:   Diagnosis Date   . Arthritis    . Asthma    . Barrett's esophagus    . COPD (chronic obstructive pulmonary disease)    . Emphysema lung    . Fibromyalgia    . Interstitial lung disease    . Osteoporosis    . Seasonal allergic rhinitis        Past Surgical History:   Procedure Laterality Date   . ELBOW SURGERY      (L) elbow   . HYSTERECTOMY     . torn labrium         Family History   Problem Relation Age of Onset   . Heart disease Mother    . Emphysema Father        Social  Social History     Tobacco Use   . Smoking status: Current Every Day Smoker     Packs/day: 0.50     Types: Cigarettes   . Smokeless tobacco: Never Used   Vaping Use   . Vaping Use: Never used   Substance Use Topics   . Alcohol use: Not Currently   . Drug use: Yes     Types: Marijuana     Comment: edibles       .     No Known Allergies    Home Medications             albuterol (PROVENTIL) (5 MG/ML) 0.5% nebulizer solution          alendronate (FOSAMAX) 70 MG tablet       70 mg, 1 tab(s), Oral, q7day, # 4 tab(s), 0 Refill(s)     DULoxetine (CYMBALTA) 60 MG capsule     1 capsule every 24 hours     gabapentin  (NEURONTIN) 300 MG capsule     Take 1 capsule (300 mg total) by mouth 3 (three) times daily     montelukast (SINGULAIR) 10 MG tablet     Take 10 mg by mouth daily     Multiple Vitamins-Minerals (ONE-A-DAY 50 PLUS PO)     Take by mouth     tiotropium (Spiriva HandiHaler) 18 MCG inhalation capsule          Trelegy Ellipta 100-62.5-25 MCG/INH Aerosol Pwdr, Breath Activated          vitamin D, ergocalciferol, (DRISDOL) 50000 UNIT Cap     Take 1 capsule (50,000 Units total) by mouth once a week                     Flagged for Removal  Breo Ellipta 100-25 MCG/INH Aerosol Pwdr, Breath Activated     TAKE 1 PUFF BY MOUTH EVERY DAY     doxycycline (VIBRA-TABS) 100 MG tablet     Take 100 mg by mouth every 12 (twelve) hours     triamcinolone (KENALOG) 0.1 % cream     APPLY UP TO TWICE A DAY FOR 2 WEEKS/MONTH AS NEEDED. AVOID PROLONGED USE           Review of Systems   Constitutional: Positive for chills and fatigue. Negative for fever.   HENT: Positive for congestion.    Eyes: Negative for visual disturbance.   Respiratory: Positive for cough, shortness of breath and wheezing.    Cardiovascular: Negative for chest pain.   Gastrointestinal: Negative for abdominal pain and vomiting.   Musculoskeletal: Negative for back pain.   Neurological: Negative for headaches.       Physical Exam    BP: 119/82, Heart Rate: 77, Temp: 98.2 F (36.8 C), Resp Rate: 20, SpO2: 99 %, Weight: 55.8 kg    Physical Exam  Vitals and nursing note reviewed.   Constitutional:       General: She is not in acute distress.     Appearance: She is well-developed. She is not toxic-appearing or diaphoretic.   HENT:      Head: Normocephalic and atraumatic.      Mouth/Throat:      Mouth: Mucous membranes are moist.      Pharynx: Oropharynx is clear.   Eyes:      Extraocular Movements: Extraocular movements intact.      Pupils: Pupils are equal, round, and reactive to light.   Cardiovascular:      Rate and Rhythm: Normal rate and regular rhythm.       Pulses: Normal pulses.   Pulmonary:      Effort: Pulmonary effort is normal. Tachypnea present. No respiratory distress.      Breath sounds: Examination of the right-middle field reveals wheezing and rhonchi. Examination of the left-middle field reveals wheezing and rhonchi. Wheezing and rhonchi present.   Abdominal:      General: There is no distension.      Palpations: Abdomen is soft.      Tenderness: There is no abdominal tenderness.   Musculoskeletal:         General: Normal range of motion.      Cervical back: Full passive range of motion without pain, normal range of motion and neck supple.   Skin:     General: Skin is warm and dry.      Capillary Refill: Capillary refill takes less than 2 seconds.   Neurological:      Mental Status: She is alert and oriented to person, place, and time.         EKG shows normal sinus rhythm with sinus arrhythmia, ventricular rate of 80 bpm, PR interval 122 ms, QRS duration 76 ms, QTC 438 ms, no frank signs of ST elevation    Results     Procedure Component Value Units Date/Time    Rapid influenza A/B antigens [[id number]] Collected: 09/17/20 1224    Specimen: Nasopharyngeal Swab from Nasal Aspirate Updated: 09/17/20 1443    Narrative:      ORDER#: [id number]                                    ORDERED BY: Wilber Bihari  SOURCE: Nasal Aspirate  COLLECTED:  09/17/20 12:24  ANTIBIOTICS AT COLL.:                                RECEIVED :  09/17/20 14:27  Influenza Rapid Antigen A&B                FINAL       09/17/20 14:43  09/17/20   Negative for Influenza A and B             Reference Range: Negative      COVID-19 (SARS-CoV-2) (ID Now) [[id number]] Collected: 09/17/20 1224    Specimen: Nasopharyngeal Swab from Nasopharynx Updated: 09/17/20 1316     Purpose of COVID testing Diagnostic -[facility]     SARS-CoV-2 Specimen Source Nasopharyngeal     SARS CoV-2 Negative    Narrative:      o Collect and clearly label specimen type:  o Upper respiratory specimen: One  Nasopharyngeal Dry Swab NO  Transport Media.  o Hand deliver to laboratory ASAP  Diagnostic -[facility]    GFR [[id number]] Collected: 09/17/20 1224     Updated: 09/17/20 1301     EGFR >60.0    Comprehensive metabolic panel [[id number]]  (Abnormal) Collected: 09/17/20 1224    Specimen: Blood Updated: 09/17/20 1301     Glucose 84 mg/dL      BUN 9.0 mg/dL      Creatinine 0.6 mg/dL      Sodium 518 mEq/L      Potassium 4.4 mEq/L      Chloride 96 mEq/L      CO2 25 mEq/L      Calcium 9.5 mg/dL      Protein, Total 6.4 g/dL      Albumin 3.8 g/dL      AST (SGOT) 17 U/L      ALT 23 U/L      Alkaline Phosphatase 103 U/L      Bilirubin, Total 0.6 mg/dL      Globulin 2.6 g/dL      Albumin/Globulin Ratio 1.5     Anion Gap 12.0    CBC and differential [[id number]]  (Abnormal) Collected: 09/17/20 1224    Specimen: Blood Updated: 09/17/20 1241     WBC 10.13 x10 3/uL      Hgb 13.3 g/dL      Hematocrit 16.0 %      Platelets 371 x10 3/uL      RBC 4.38 x10 6/uL      MCV 89.3 fL      MCH 30.4 pg      MCHC 34.0 g/dL      RDW 14 %      MPV 9.5 fL      Neutrophils 69.8 %      Lymphocytes Automated 20.4 %      Monocytes 6.9 %      Eosinophils Automated 2.2 %      Basophils Automated 0.5 %      Immature Granulocytes 0.2 %      Nucleated RBC 0.0 /100 WBC      Neutrophils Absolute 7.07 x10 3/uL      Lymphocytes Absolute Automated 2.07 x10 3/uL      Monocytes Absolute Automated 0.70 x10 3/uL      Eosinophils Absolute Automated 0.22 x10 3/uL      Basophils Absolute Automated 0.05 x10 3/uL      Immature Granulocytes Absolute 0.02 x10 3/uL  Absolute NRBC 0.00 x10 3/uL         Chest AP Portable    Result Date: 09/17/2020   Minimal blunting of both CP angles consistent with scarring or small effusions. Adjacent left lower lung subsegmental atelectasis. Kinnie Feil, MD  09/17/2020 12:39 PM    MDM and ED Course     ED Medication Orders (From admission, onward)    Start Ordered     Status Ordering Provider    09/17/20 1424 09/17/20 1423  ondansetron (ZOFRAN)  injection 4 mg  Once        Route: Intravenous  Ordered Dose: 4 mg     Last MAR action: Given Ahri Olson, [facility] S    09/17/20 1250 09/17/20 1249  albuterol (PROVENTIL) (2.5 MG/3ML) 0.083% nebulizer solution 2.5 mg  RT - Once        Route: Nebulization  Ordered Dose: 2.5 mg     Last MAR action: Given Luan Maberry, [facility] S    09/17/20 1250 09/17/20 1249  ipratropium (ATROVENT) 0.02 % nebulizer solution 0.5 mg  RT - Once        Route: Nebulization  Ordered Dose: 0.5 mg     Last MAR action: Given Earnest Mcgillis, [facility] S    09/17/20 1250 09/17/20 1249  sodium chloride 0.9 % bolus 1,000 mL  Once        Route: Intravenous  Ordered Dose: 1,000 mL     Last MAR action: Ysidro Evert, [facility] S    09/17/20 1250 09/17/20 1249  methylPREDNISolone sodium succinate (Solu-MEDROL) injection 125 mg  Once        Route: Intravenous  Ordered Dose: 125 mg     Last MAR action: Given Cristen Bredeson, [facility] S    09/17/20 1250 09/17/20 1249  cefTRIAXone (ROCEPHIN) injection 1 g  Once        Route: Intravenous  Ordered Dose: 1 g     Last MAR action: Given Sebastien Jackson S    09/17/20 1250 09/17/20 1249  azithromycin (ZITHROMAX) tablet 500 mg  Once        Route: Oral  Ordered Dose: 500 mg     Last MAR action: Given Kevante Lunt, [facility] S             MDM  The patient presents to the ED with chief complaint of shortness of breath.  Differential diagnosis considered included [asthma exacerbation, acute bronchitis, COPD, URI, pulmonary embolus, acute coronary syndrome, pneumothorax, pneumonia, or anemia.]  All labs and diagnostic studies were reviewed and unremarkable.  Given the patient's clinical history, past medical history, and exam findings, the  presentation was consistent with an acute COPD exacerbation and possible pneumonia.     The patient was not in acute respiratory distress.  The bronchospasm and wheezing appeared to be relatively minor.  The patient was given [#1 nebulized] albuterol treatments and showed significant improvement in symptoms.  [The patient was  also given steroids to reduce airway inflammation.]  There was no significant hypoxia or evidence of respiratory distress.  The patient had near resolution of symptoms and was felt to be stable for discharge to home.  Patient already has a nebulizer and inhalers at home.  Antibiotics were were warranted and was given IV Rocephin and a dose of Zithromax and steroids.  Patient was also given IV fluids.  Patient was well for COVID which was negative and flu was still pending.  Patient was offered admission which she declined satting 97% on room air without any signs of respiratory  distress and felt comfortable being discharged home to follow-up with her pulmonologist and primary care doctor.  The patient should return for high fevers, worsening shortness of breath, difficulty breathing, severe chest pain, or fainting.  Known triggers of asthma were instructed to be avoided, this includes any first hand or secondhand smoke.   The patient understood follow-up instructions and had no further questions.                       Clinical Impression & Disposition     Clinical Impression  Final diagnoses:   COPD with acute exacerbation        ED Disposition     ED Disposition   Discharge    Condition   --    Date/Time   Sat Sep 17, 2020  2:31 PM    Comment   Kharis Lapenna discharge to home/self care.    Condition at disposition: Stable                Discharge Medication List as of 09/17/2020  2:31 PM      START taking these medications    Details   levoFLOXacin (LEVAQUIN) 500 MG tablet Take 1 tablet (500 mg total) by mouth daily for 7 days, Starting Sat 09/17/2020, Until Sat 09/24/2020, E-Rx      predniSONE (DELTASONE) 20 MG tablet Take 2 tablets (40 mg total) by mouth daily for 5 days, Starting Sat 09/17/2020, Until Thu 09/22/2020, E-Rx                       Dereck Leep, MD  09/17/20 1536

## 2020-09-28 ENCOUNTER — Other Ambulatory Visit: Payer: Self-pay | Admitting: Critical Care Medicine

## 2020-10-07 ENCOUNTER — Ambulatory Visit: Payer: 59 | Attending: Critical Care Medicine

## 2020-10-07 DIAGNOSIS — J449 Chronic obstructive pulmonary disease, unspecified: Secondary | ICD-10-CM | POA: Insufficient documentation

## 2020-10-07 NOTE — Progress Notes (Signed)
Pulmonary Function Testing completed; ATS/ERS criteria met.  To view full .pdf report, click on:  Chart Review > Procedures > Spirometry > View Image.        10/07/20 1003   Spirometry Pre and/or Post   Procedure Location and Type [facility] - Pre Only   Pre FVC 3.06 L   Pre FEV1 1.15 L   Pre FEV1/FVC % (Calculated) 38   Lung Volumes   Method Body box   Procedure status Completed   DLCO   Procedure status Completed   Primary Charges   $ Diffusing Capacity Done? Yes   $ Plethysmography Airway Resistance Performed? Yes   $ Spirometry Type Performed Simple - CPT 94010

## 2020-10-19 LAB — LUNG VOLUMES
DL/VA- %Pred: 48 %
DL/VA- Actual: 2.03 ml/min/mmHg/L
DL/VA- Pred: 4.17 ml/min/mmHg/L
DLCOcor (Lower Limit of Normal): 16.32 ml/min/mmHg
DLCOcor- %Pred: 47 %
DLCOcor- Actual: 10.02 ml/min/mmHg
DLCOcor- Pred: 21.12 ml/min/mmHg
DLCOunc (Lower Limit of Normal): 16.32 ml/min/mmHg
DLCOunc- %Pred: 47 %
DLCOunc- Actual: 9.99 ml/min/mmHg
DLCOunc- Pred: 21.12 ml/min/mmHg
ERV (Pre-Bronch) %Pred: 68 %
ERV (Pre-Bronch) Actual: 0.83 L
ERV (Pre-Bronch) Pred: 1.21 L
ERVSB (Pre-Actual): 0.83 L
FEF 25-75% (Pre-Bronch) %Pred: 13 %
FEF 25-75% (Pre-Bronch) Actual: 0.34 L/sec
FEF 25-75% (Pre-Bronch) Pred: 2.56 L/sec
FEF Max (Pre-Bronch) %Pred: 51 %
FEF Max (Pre-Bronch) Actual: 3.5 L/sec
FEF Max (Pre-Bronch) Pred: 6.81 L/sec
FEF2575 (Lower Limit of Normal): 1.36 L/sec
FEF2575 (Standard Deviation): 0.84 L/sec
FEFMax (Lower Limit of Normal): 4.98 L/sec
FEFMax (Standard Deviation): 1.11 L/sec
FEV1 (Lower Limit of Normal): 2.13 L
FEV1 (Pre-Bronch) %Pred: 41 %
FEV1 (Pre-Bronch) Actual: 1.15 L
FEV1 (Pre-Bronch) Pred: 2.78 L
FEV1 (Standard Deviation): 0.39 L
FEV1/FVC (Pre-Bronch) %Pred: 47 %
FEV1/FVC (Pre-Bronch) Actual: 38 %
FEV1/FVC (Pre-Bronch) Pred: 80 %
FVC (Lower Limit of Normal): 2.71 L
FVC (Pre-Bronch) %Pred: 86 %
FVC (Pre-Bronch) Actual: 3.06 L
FVC (Pre-Bronch) Pred: 3.53 L
FVC (Standard Deviation): 0.51 L
Gaw (Pre-Bronch) %Pred: 35 %
Gaw (Pre-Bronch) Actual: 0.37 L/s/cmH2O
Gaw (Pre-Bronch) Pred: 1.03 L/s/cmH2O
IC (Pre-Bronch) %Pred: 84 %
IC (Pre-Bronch) Actual: 2.04 L
IC (Pre-Bronch) Pred: 2.41 L
ICTLCPleth (Pre-Actual): 32 %
ICTLCSB (Pre-Actual): 40 %
PEF (Pre-Actual): 210.1 L/min
RV (Pleth) (Pre-Bronch) %Pred: 172 %
RV (Pleth) (Pre-Bronch) Actual: 3.46 L
RV (Pleth) (Pre-Bronch) Pred: 2.01 L
RV/TLC (Pleth) (Pre-Bronch) %Pred: 145 %
RV/TLC (Pleth) (Pre-Bronch) Actual: 55 %
RV/TLC (Pleth) (Pre-Bronch) Pred: 38 %
RVPleth (Lower Limit of Normal): 1.25 L
RVPleth (Standard Deviation): 0.38 L
RVSB (Pre-Actual): 2.55 L
RVTLCPl (Lower Limit of Normal): 27 %
RVTLCPl (Standard Deviation): 5 %
RVTLCSB (Pre-Actual): 50 %
Raw (Lower Limit of Normal): 1.15 cmH2O/L/s
Raw (Pre-Bronch) %Pred: 150 %
Raw (Pre-Bronch) Actual: 2.8 cmH2O/L/s
Raw (Pre-Bronch) Pred: 1.86 cmH2O/L/s
Raw (Standard Deviation): 0.43 cmH2O/L/s
SVCSB (Pre-Actual): 2.54 L
TGV (Pre-Bronch) %Pred: 141 %
TGV (Pre-Bronch) Actual: 4.28 L
TGV (Pre-Bronch) Pred: 3.03 L
TGVPleth (Lower Limit of Normal): 1.98 L
TGVPleth (Standard Deviation): 0.52 L
TLC (Pleth) (Pre-Bronch) %Pred: 118 %
TLC (Pleth) (Pre-Bronch) Actual: 6.32 L
TLC (Pleth) (Pre-Bronch) Pred: 5.35 L
TLCPleth (Lower Limit of Normal): 4.28 L
TLCPleth (Standard Deviation): 0.54 L
VA (% Predicted-Pre): 97 %
VA (Lower Limit of Normal): 4.12 L
VA (Pre-Actual): 4.93 L
VA- Pred: 5.07 L
sGaw (Lower Limit of Normal): 0.14 1/cmH2O*s
sGaw (Pre-Bronch) %Pred: 35 %
sGaw (Pre-Bronch) Actual: 0.07 1/cmH2O*s
sGaw (Pre-Bronch) Pred: 0.2 1/cmH2O*s
sGaw (Standard Deviation): 0.04 1/cmH2O*s
sRaw (Pre-Bronch) %Pred: 299 %
sRaw (Pre-Bronch) Actual: 14.26 cmH2O*s
sRaw (Pre-Bronch) Pred: 4.76 cmH2O*s

## 2020-10-19 LAB — SPIROMETRY WITHOUT BRONCHODILATOR
DL/VA- %Pred: 48 %
DL/VA- Actual: 2.03 ml/min/mmHg/L
DL/VA- Pred: 4.17 ml/min/mmHg/L
DLCOcor (Lower Limit of Normal): 16.32 ml/min/mmHg
DLCOcor- %Pred: 47 %
DLCOcor- Actual: 10.02 ml/min/mmHg
DLCOcor- Pred: 21.12 ml/min/mmHg
DLCOunc (Lower Limit of Normal): 16.32 ml/min/mmHg
DLCOunc- %Pred: 47 %
DLCOunc- Actual: 9.99 ml/min/mmHg
DLCOunc- Pred: 21.12 ml/min/mmHg
ERV (Pre-Bronch) %Pred: 68 %
ERV (Pre-Bronch) Actual: 0.83 L
ERV (Pre-Bronch) Pred: 1.21 L
ERVSB (Pre-Actual): 0.83 L
FEF 25-75% (Pre-Bronch) %Pred: 13 %
FEF 25-75% (Pre-Bronch) Actual: 0.34 L/sec
FEF 25-75% (Pre-Bronch) Pred: 2.56 L/sec
FEF Max (Pre-Bronch) %Pred: 51 %
FEF Max (Pre-Bronch) Actual: 3.5 L/sec
FEF Max (Pre-Bronch) Pred: 6.81 L/sec
FEF2575 (Lower Limit of Normal): 1.36 L/sec
FEF2575 (Standard Deviation): 0.84 L/sec
FEFMax (Lower Limit of Normal): 4.98 L/sec
FEFMax (Standard Deviation): 1.11 L/sec
FEV1 (Lower Limit of Normal): 2.13 L
FEV1 (Pre-Bronch) %Pred: 41 %
FEV1 (Pre-Bronch) Actual: 1.15 L
FEV1 (Pre-Bronch) Pred: 2.78 L
FEV1 (Standard Deviation): 0.39 L
FEV1/FVC (Pre-Bronch) %Pred: 47 %
FEV1/FVC (Pre-Bronch) Actual: 38 %
FEV1/FVC (Pre-Bronch) Pred: 80 %
FVC (Lower Limit of Normal): 2.71 L
FVC (Pre-Bronch) %Pred: 86 %
FVC (Pre-Bronch) Actual: 3.06 L
FVC (Pre-Bronch) Pred: 3.53 L
FVC (Standard Deviation): 0.51 L
Gaw (Pre-Bronch) %Pred: 35 %
Gaw (Pre-Bronch) Actual: 0.37 L/s/cmH2O
Gaw (Pre-Bronch) Pred: 1.03 L/s/cmH2O
IC (Pre-Bronch) %Pred: 84 %
IC (Pre-Bronch) Actual: 2.04 L
IC (Pre-Bronch) Pred: 2.41 L
ICTLCPleth (Pre-Actual): 32 %
ICTLCSB (Pre-Actual): 40 %
PEF (Pre-Actual): 210.1 L/min
RV (Pleth) (Pre-Bronch) %Pred: 172 %
RV (Pleth) (Pre-Bronch) Actual: 3.46 L
RV (Pleth) (Pre-Bronch) Pred: 2.01 L
RV/TLC (Pleth) (Pre-Bronch) %Pred: 145 %
RV/TLC (Pleth) (Pre-Bronch) Actual: 55 %
RV/TLC (Pleth) (Pre-Bronch) Pred: 38 %
RVPleth (Lower Limit of Normal): 1.25 L
RVPleth (Standard Deviation): 0.38 L
RVSB (Pre-Actual): 2.55 L
RVTLCPl (Lower Limit of Normal): 27 %
RVTLCPl (Standard Deviation): 5 %
RVTLCSB (Pre-Actual): 50 %
Raw (Lower Limit of Normal): 1.15 cmH2O/L/s
Raw (Pre-Bronch) %Pred: 150 %
Raw (Pre-Bronch) Actual: 2.8 cmH2O/L/s
Raw (Pre-Bronch) Pred: 1.86 cmH2O/L/s
Raw (Standard Deviation): 0.43 cmH2O/L/s
SVCSB (Pre-Actual): 2.54 L
TGV (Pre-Bronch) %Pred: 141 %
TGV (Pre-Bronch) Actual: 4.28 L
TGV (Pre-Bronch) Pred: 3.03 L
TGVPleth (Lower Limit of Normal): 1.98 L
TGVPleth (Standard Deviation): 0.52 L
TLC (Pleth) (Pre-Bronch) %Pred: 118 %
TLC (Pleth) (Pre-Bronch) Actual: 6.32 L
TLC (Pleth) (Pre-Bronch) Pred: 5.35 L
TLCPleth (Lower Limit of Normal): 4.28 L
TLCPleth (Standard Deviation): 0.54 L
VA (% Predicted-Pre): 97 %
VA (Lower Limit of Normal): 4.12 L
VA (Pre-Actual): 4.93 L
VA- Pred: 5.07 L
sGaw (Lower Limit of Normal): 0.14 1/cmH2O*s
sGaw (Pre-Bronch) %Pred: 35 %
sGaw (Pre-Bronch) Actual: 0.07 1/cmH2O*s
sGaw (Pre-Bronch) Pred: 0.2 1/cmH2O*s
sGaw (Standard Deviation): 0.04 1/cmH2O*s
sRaw (Pre-Bronch) %Pred: 299 %
sRaw (Pre-Bronch) Actual: 14.26 cmH2O*s
sRaw (Pre-Bronch) Pred: 4.76 cmH2O*s

## 2020-10-19 LAB — CARBON MONOXIDE DIFFUSING CAPACITY
DL/VA- %Pred: 48 %
DL/VA- Actual: 2.03 ml/min/mmHg/L
DL/VA- Pred: 4.17 ml/min/mmHg/L
DLCOcor (Lower Limit of Normal): 16.32 ml/min/mmHg
DLCOcor- %Pred: 47 %
DLCOcor- Actual: 10.02 ml/min/mmHg
DLCOcor- Pred: 21.12 ml/min/mmHg
DLCOunc (Lower Limit of Normal): 16.32 ml/min/mmHg
DLCOunc- %Pred: 47 %
DLCOunc- Actual: 9.99 ml/min/mmHg
DLCOunc- Pred: 21.12 ml/min/mmHg
ERV (Pre-Bronch) %Pred: 68 %
ERV (Pre-Bronch) Actual: 0.83 L
ERV (Pre-Bronch) Pred: 1.21 L
ERVSB (Pre-Actual): 0.83 L
FEF 25-75% (Pre-Bronch) %Pred: 13 %
FEF 25-75% (Pre-Bronch) Actual: 0.34 L/sec
FEF 25-75% (Pre-Bronch) Pred: 2.56 L/sec
FEF Max (Pre-Bronch) %Pred: 51 %
FEF Max (Pre-Bronch) Actual: 3.5 L/sec
FEF Max (Pre-Bronch) Pred: 6.81 L/sec
FEF2575 (Lower Limit of Normal): 1.36 L/sec
FEF2575 (Standard Deviation): 0.84 L/sec
FEFMax (Lower Limit of Normal): 4.98 L/sec
FEFMax (Standard Deviation): 1.11 L/sec
FEV1 (Lower Limit of Normal): 2.13 L
FEV1 (Pre-Bronch) %Pred: 41 %
FEV1 (Pre-Bronch) Actual: 1.15 L
FEV1 (Pre-Bronch) Pred: 2.78 L
FEV1 (Standard Deviation): 0.39 L
FEV1/FVC (Pre-Bronch) %Pred: 47 %
FEV1/FVC (Pre-Bronch) Actual: 38 %
FEV1/FVC (Pre-Bronch) Pred: 80 %
FVC (Lower Limit of Normal): 2.71 L
FVC (Pre-Bronch) %Pred: 86 %
FVC (Pre-Bronch) Actual: 3.06 L
FVC (Pre-Bronch) Pred: 3.53 L
FVC (Standard Deviation): 0.51 L
Gaw (Pre-Bronch) %Pred: 35 %
Gaw (Pre-Bronch) Actual: 0.37 L/s/cmH2O
Gaw (Pre-Bronch) Pred: 1.03 L/s/cmH2O
IC (Pre-Bronch) %Pred: 84 %
IC (Pre-Bronch) Actual: 2.04 L
IC (Pre-Bronch) Pred: 2.41 L
ICTLCPleth (Pre-Actual): 32 %
ICTLCSB (Pre-Actual): 40 %
PEF (Pre-Actual): 210.1 L/min
RV (Pleth) (Pre-Bronch) %Pred: 172 %
RV (Pleth) (Pre-Bronch) Actual: 3.46 L
RV (Pleth) (Pre-Bronch) Pred: 2.01 L
RV/TLC (Pleth) (Pre-Bronch) %Pred: 145 %
RV/TLC (Pleth) (Pre-Bronch) Actual: 55 %
RV/TLC (Pleth) (Pre-Bronch) Pred: 38 %
RVPleth (Lower Limit of Normal): 1.25 L
RVPleth (Standard Deviation): 0.38 L
RVSB (Pre-Actual): 2.55 L
RVTLCPl (Lower Limit of Normal): 27 %
RVTLCPl (Standard Deviation): 5 %
RVTLCSB (Pre-Actual): 50 %
Raw (Lower Limit of Normal): 1.15 cmH2O/L/s
Raw (Pre-Bronch) %Pred: 150 %
Raw (Pre-Bronch) Actual: 2.8 cmH2O/L/s
Raw (Pre-Bronch) Pred: 1.86 cmH2O/L/s
Raw (Standard Deviation): 0.43 cmH2O/L/s
SVCSB (Pre-Actual): 2.54 L
TGV (Pre-Bronch) %Pred: 141 %
TGV (Pre-Bronch) Actual: 4.28 L
TGV (Pre-Bronch) Pred: 3.03 L
TGVPleth (Lower Limit of Normal): 1.98 L
TGVPleth (Standard Deviation): 0.52 L
TLC (Pleth) (Pre-Bronch) %Pred: 118 %
TLC (Pleth) (Pre-Bronch) Actual: 6.32 L
TLC (Pleth) (Pre-Bronch) Pred: 5.35 L
TLCPleth (Lower Limit of Normal): 4.28 L
TLCPleth (Standard Deviation): 0.54 L
VA (% Predicted-Pre): 97 %
VA (Lower Limit of Normal): 4.12 L
VA (Pre-Actual): 4.93 L
VA- Pred: 5.07 L
sGaw (Lower Limit of Normal): 0.14 1/cmH2O*s
sGaw (Pre-Bronch) %Pred: 35 %
sGaw (Pre-Bronch) Actual: 0.07 1/cmH2O*s
sGaw (Pre-Bronch) Pred: 0.2 1/cmH2O*s
sGaw (Standard Deviation): 0.04 1/cmH2O*s
sRaw (Pre-Bronch) %Pred: 299 %
sRaw (Pre-Bronch) Actual: 14.26 cmH2O*s
sRaw (Pre-Bronch) Pred: 4.76 cmH2O*s

## 2020-12-02 ENCOUNTER — Ambulatory Visit (INDEPENDENT_AMBULATORY_CARE_PROVIDER_SITE_OTHER): Payer: 59 | Admitting: Internal Medicine

## 2020-12-04 ENCOUNTER — Other Ambulatory Visit (INDEPENDENT_AMBULATORY_CARE_PROVIDER_SITE_OTHER): Payer: Self-pay | Admitting: Internal Medicine

## 2020-12-06 ENCOUNTER — Encounter (INDEPENDENT_AMBULATORY_CARE_PROVIDER_SITE_OTHER): Payer: Self-pay
